# Patient Record
Sex: Male | Born: 2000 | Race: White | Marital: Single | Smoking: Never smoker
Health system: Southern US, Community
[De-identification: ages and names within clinical notes are randomized; demographics above are authoritative.]

---
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
## 2020-01-18 ENCOUNTER — Encounter (INDEPENDENT_AMBULATORY_CARE_PROVIDER_SITE_OTHER): Payer: Self-pay | Admitting: Otolaryngology

## 2020-01-18 ENCOUNTER — Ambulatory Visit (INDEPENDENT_AMBULATORY_CARE_PROVIDER_SITE_OTHER): Payer: 59 | Admitting: Otolaryngology

## 2020-01-18 ENCOUNTER — Other Ambulatory Visit: Payer: Self-pay

## 2020-01-18 VITALS — Temp 97.9°F

## 2020-01-18 DIAGNOSIS — J343 Hypertrophy of nasal turbinates: Secondary | ICD-10-CM | POA: Diagnosis not present

## 2020-01-18 DIAGNOSIS — J310 Chronic rhinitis: Secondary | ICD-10-CM

## 2020-01-18 NOTE — Progress Notes (Signed)
HPI: Robert Pace is a 19 y.o. male who presents for evaluation of nasal obstruction which is worse at night.  It alternates from side to side.  He has used Flonase intermittently in the past with some benefit.  Denies any yellow-green discharge.  No fever.  He is breathing reasonably well today in the office.  No past medical history on file.  Social History   Socioeconomic History  . Marital status: Single    Spouse name: Not on file  . Number of children: Not on file  . Years of education: Not on file  . Highest education level: Not on file  Occupational History  . Not on file  Tobacco Use  . Smoking status: Never Smoker  . Smokeless tobacco: Never Used  Substance and Sexual Activity  . Alcohol use: Not on file  . Drug use: Not on file  . Sexual activity: Not on file  Other Topics Concern  . Not on file  Social History Narrative  . Not on file   Social Determinants of Health   Financial Resource Strain:   . Difficulty of Paying Living Expenses: Not on file  Food Insecurity:   . Worried About Programme researcher, broadcasting/film/video in the Last Year: Not on file  . Ran Out of Food in the Last Year: Not on file  Transportation Needs:   . Lack of Transportation (Medical): Not on file  . Lack of Transportation (Non-Medical): Not on file  Physical Activity:   . Days of Exercise per Week: Not on file  . Minutes of Exercise per Session: Not on file  Stress:   . Feeling of Stress : Not on file  Social Connections:   . Frequency of Communication with Friends and Family: Not on file  . Frequency of Social Gatherings with Friends and Family: Not on file  . Attends Religious Services: Not on file  . Active Member of Clubs or Organizations: Not on file  . Attends Banker Meetings: Not on file  . Marital Status: Not on file   No family history on file. Not on File Prior to Admission medications   Not on File     Positive ROS: Otherwise negative  All other systems have been  reviewed and were otherwise negative with the exception of those mentioned in the HPI and as above.  Physical Exam: Constitutional: Alert, well-appearing, no acute distress Ears: External ears without lesions or tenderness. Ear canals are clear bilaterally with intact, clear TMs.  Nasal: External nose without lesions. Septum with mild deformity to the right.  Moderate turbinate per trophy.. Clear nasal passages with no signs of polyps.  Both middle meatus regions are clear. Oral: Lips and gums without lesions. Tongue and palate mucosa without lesions. Posterior oropharynx clear. Neck: No palpable adenopathy or masses Respiratory: Breathing comfortably  Skin: No facial/neck lesions or rash noted.  Procedures  Assessment: Chronic rhinitis with mild septal deformity and turbinate hypertrophy.  Plan: Would recommend regular use of nasal steroid spray and suggested using Nasacort 2 sprays each nostril at night as well as use of saline irrigation during the day. If he continues to have chronic nasal obstruction he would be a candidate for septoplasty and turbinate reductions if symptoms warrant surgery but initially would use medical therapy.  Narda Bonds, MD

## 2020-01-30 IMAGING — CR DG CHEST 2V
2 series · 2 of 2 positions shown · non-contrast
Comparison: None.

CLINICAL DATA: Cough congestion fever

EXAM:
CHEST - 2 VIEW

[w chest pa]
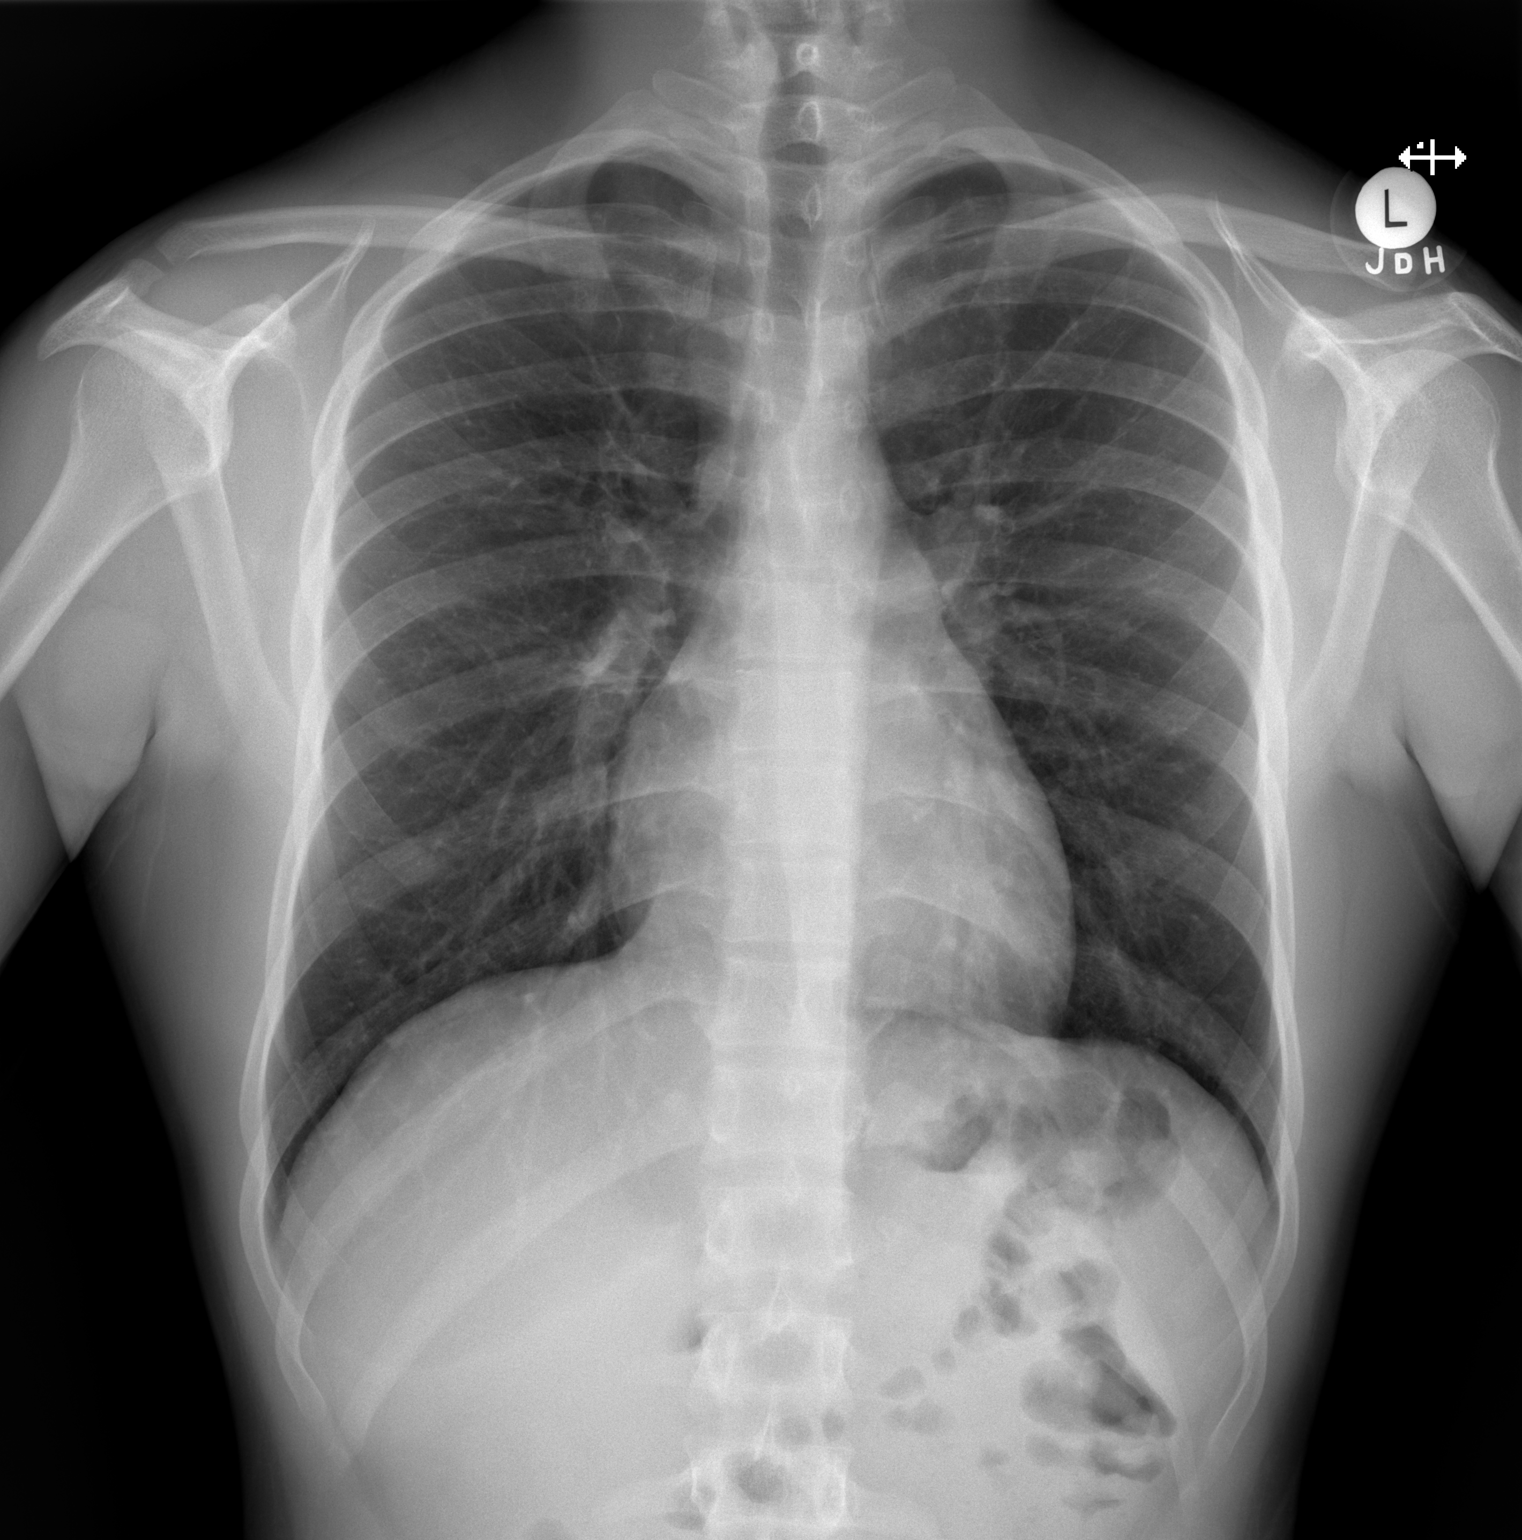

[w chest lat]
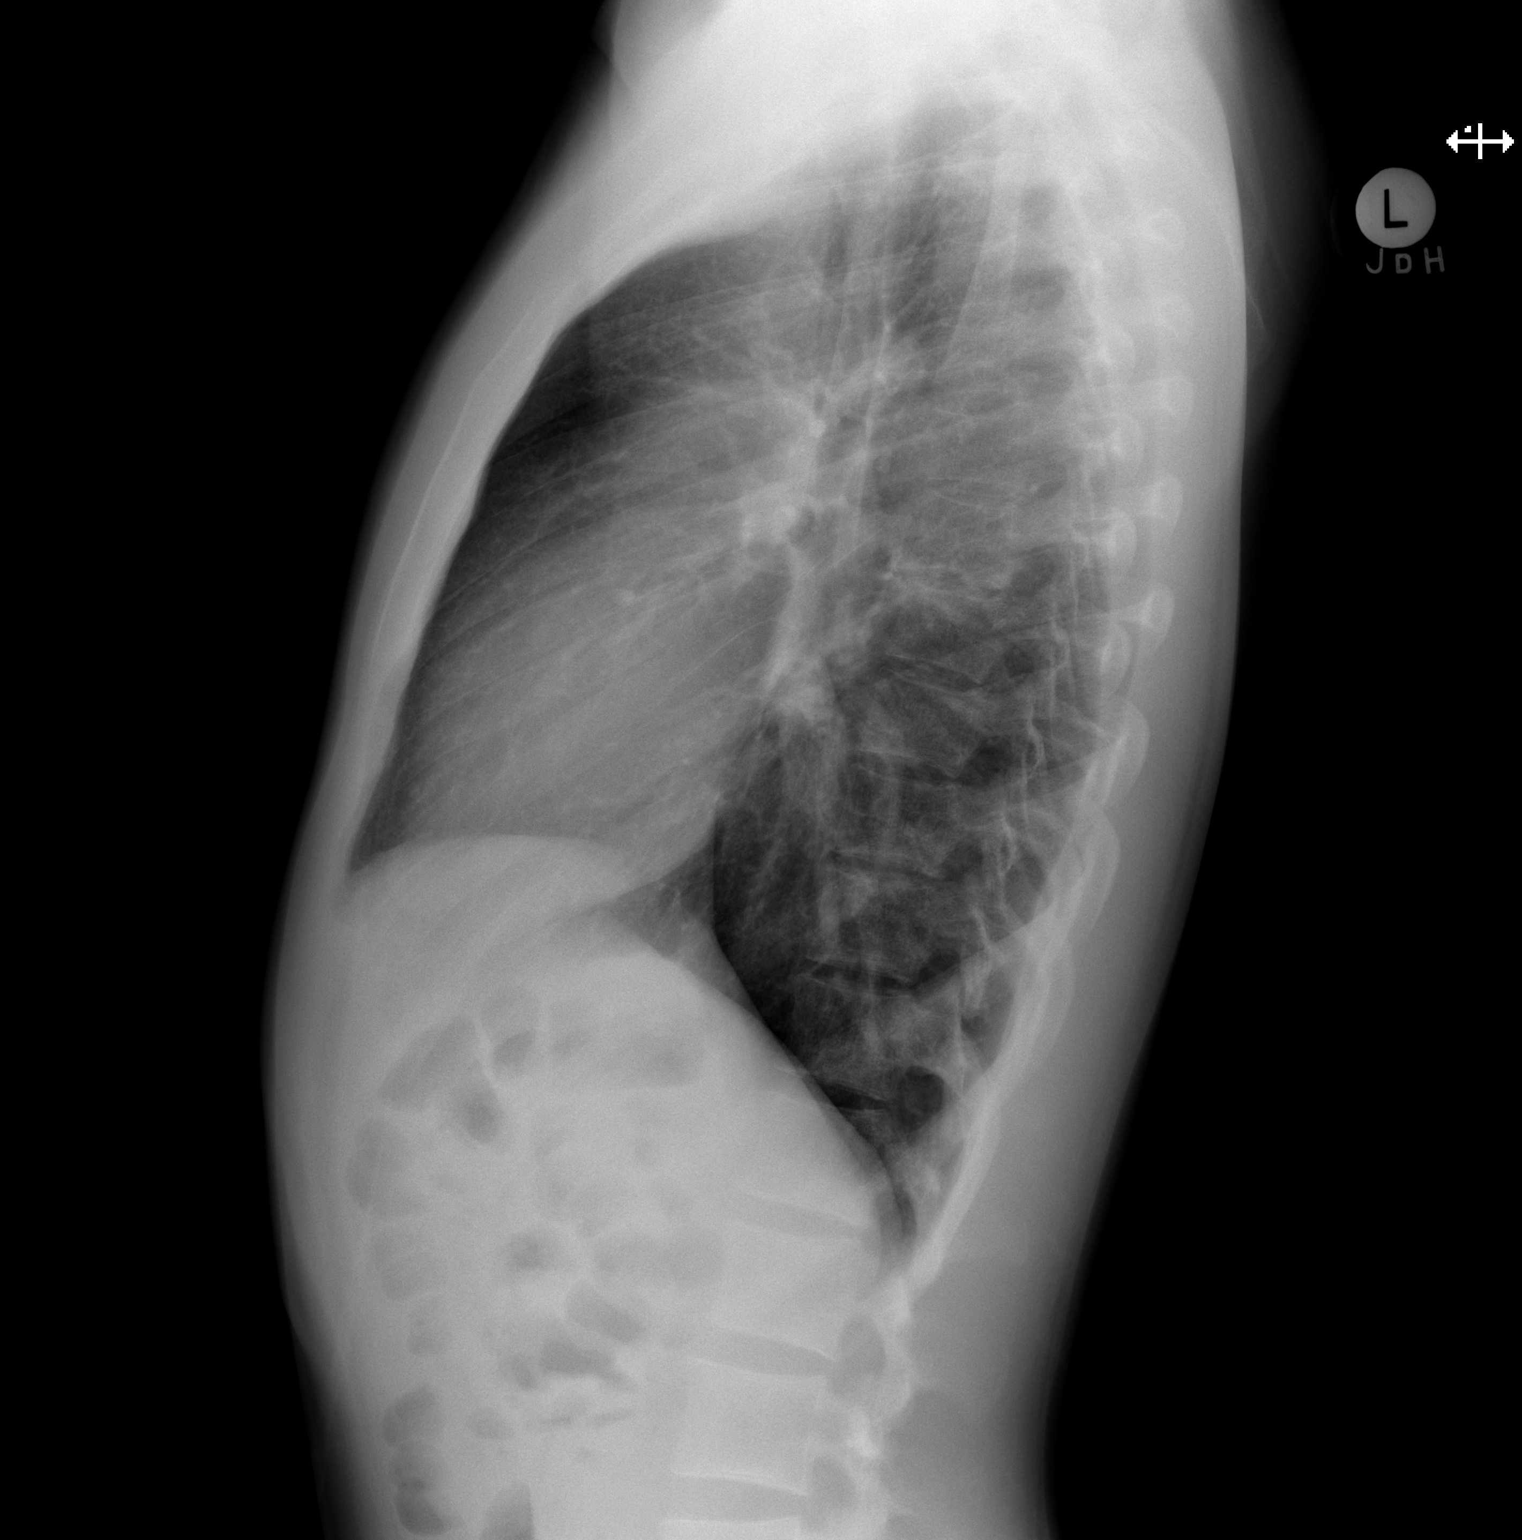

[2 of 2 positions shown; findings below may reference images not displayed]

FINDINGS: The heart size and mediastinal contours are within normal limits.
Both lungs are clear. The visualized skeletal structures are
unremarkable.
IMPRESSION: No active cardiopulmonary disease.
